# Patient Record
Sex: Female | Born: 1980 | Race: Black or African American | Hispanic: No | Marital: Single | Smoking: Never smoker
Health system: Southern US, Community
[De-identification: ages and names within clinical notes are randomized; demographics above are authoritative.]

---
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
## 2019-07-14 ENCOUNTER — Ambulatory Visit (INDEPENDENT_AMBULATORY_CARE_PROVIDER_SITE_OTHER)
Admission: RE | Admit: 2019-07-14 | Discharge: 2019-07-14 | Disposition: A | Discharge status: Home / Self Care | Payer: Commercial Managed Care - PPO | Source: Ambulatory Visit

## 2019-07-14 ENCOUNTER — Emergency Department (HOSPITAL_BASED_OUTPATIENT_CLINIC_OR_DEPARTMENT_OTHER): Payer: Commercial Managed Care - PPO

## 2019-07-14 ENCOUNTER — Encounter (HOSPITAL_BASED_OUTPATIENT_CLINIC_OR_DEPARTMENT_OTHER): Payer: Self-pay | Admitting: *Deleted

## 2019-07-14 ENCOUNTER — Other Ambulatory Visit: Payer: Self-pay

## 2019-07-14 ENCOUNTER — Emergency Department (HOSPITAL_BASED_OUTPATIENT_CLINIC_OR_DEPARTMENT_OTHER)
Admission: EM | Admit: 2019-07-14 | Discharge: 2019-07-14 | Disposition: A | Discharge status: Home / Self Care | Payer: Commercial Managed Care - PPO | Attending: Emergency Medicine | Admitting: Emergency Medicine

## 2019-07-14 VITALS — BP 141/108 | HR 109 | Temp 97.6°F | Resp 18

## 2019-07-14 DIAGNOSIS — M7989 Other specified soft tissue disorders: Secondary | ICD-10-CM | POA: Diagnosis not present

## 2019-07-14 DIAGNOSIS — M79662 Pain in left lower leg: Secondary | ICD-10-CM | POA: Diagnosis not present

## 2019-07-14 DIAGNOSIS — R2243 Localized swelling, mass and lump, lower limb, bilateral: Secondary | ICD-10-CM | POA: Diagnosis present

## 2019-07-14 NOTE — ED Triage Notes (Signed)
Left leg is swollen and painful x 4 days. She is ambulatory.

## 2019-07-14 NOTE — ED Provider Notes (Signed)
[facility]    CSN: [id number] Arrival date & time: 07/14/19  1848      History   Chief Complaint Chief Complaint  Patient presents with  . Leg Swelling    HPI Tabitha Schaefer is a 39 y.o. female presenting for left lower leg pain and swelling since this weekend.  Denies inciting event, trauma, recent change in medication, diet, lifestyle.  No fever, arthralgias, myalgias.  Patient denying erythema, chest pain, palpitations, difficulty breathing.  No headedness or dizziness.  Denies history of hypercoagulable state, smoking, OCP use.  No family history of DVT, PE.  Has tried OTC medications without relief.   History reviewed. No pertinent past medical history.  There are no problems to display for this patient.   History reviewed. No pertinent surgical history.  OB History   No obstetric history on file.      Home Medications    Prior to Admission medications   Not on File    Family History History reviewed. No pertinent family history.  Social History Social History   Tobacco Use  . Smoking status: Not on file  Substance Use Topics  . Alcohol use: Not on file  . Drug use: Not on file     Allergies   Patient has no known allergies.   Review of Systems As per HPI   Physical Exam Triage Vital Signs ED Triage Vitals  Enc Vitals Group     BP      Pulse      Resp      Temp      Temp src      SpO2      Weight      Height      Head Circumference      Peak Flow      Pain Score      Pain Loc      Pain Edu?      Excl. in GC?    No data found.  Updated Vital Signs BP (!) 141/108   Pulse (!) 109   Temp 97.6 F (36.4 C)   Resp 18   SpO2 97%   Visual Acuity Right Eye Distance:   Left Eye Distance:   Bilateral Distance:    Right Eye Near:   Left Eye Near:    Bilateral Near:     Physical Exam Constitutional:      General: She is not in acute distress. HENT:     Head: Normocephalic and atraumatic.  Eyes:     General: No  scleral icterus.    Pupils: Pupils are equal, round, and reactive to light.  Cardiovascular:     Rate and Rhythm: Normal rate.  Pulmonary:     Effort: Pulmonary effort is normal.  Musculoskeletal:        General: Tenderness present. No deformity. Normal range of motion.     Right lower leg: No edema.     Left lower leg: Edema present.     Comments: No cords palpated, negative Homans' sign bilaterally.  Right ankle 25.5 cm, right calf 41.0 cm.  Left ankle 26 cm, left calf 43.0 cm.  NVI  Skin:    Coloration: Skin is not jaundiced or pale.  Neurological:     Mental Status: She is alert and oriented to person, place, and time.      UC Treatments / Results  Labs (all labs ordered are listed, but only abnormal results are displayed) Labs Reviewed - No data to  display  EKG   Radiology No results found.  Procedures Procedures (including critical care time)  Medications Ordered in UC Medications - No data to display  Initial Impression / Assessment and Plan / UC Course  I have reviewed the triage vital signs and the nursing notes.  Pertinent labs & imaging results that were available during my care of the patient were reviewed by me and considered in my medical decision making (see chart for details).     Patient febrile, nontoxic in office today.  She is hypertensive, though without chest pain, difficulty breathing, abdominal pain.  Patient is low risk overall for blood clot, though does have significant swelling of left lower extremities compared to right without inciting event.  Recommended patient go to ER for further evaluation/DVT rule out.  Patient agreeable to this, electing to self transport and personal vehicle in stable condition. Final Clinical Impressions(s) / UC Diagnoses   Final diagnoses:  Left leg swelling   Discharge Instructions   None    ED Prescriptions    None     PDMP not reviewed this encounter.   Odette Fraction [place], [place] 07/14/19 1936

## 2019-07-14 NOTE — Discharge Instructions (Signed)
Your ultrasound did not show a blood clot in the leg.  Please take Tylenol and ibuprofen for this.  Follow-up with your family doctor in the office.  Please return for worsening pain or if you develop a fever.

## 2019-07-14 NOTE — ED Provider Notes (Signed)
[facility] EMERGENCY DEPARTMENT Provider Note   CSN: [id number] Arrival date & time: 07/14/19  1956     History Chief Complaint  Patient presents with   Leg Swelling    Tabitha Schaefer is a 39 y.o. female.  39 yo F with a chief complaints of left-sided leg pain.  Pain is on the anterior aspect and worse with ambulation and palpation.  She had worsening pain actually the beginning of her symptoms and it has improved somewhat over the past couple days.  She went to urgent care and was sent here to rule out a DVT.  She denies any history of blood clot in her legs or lungs.  She denies trauma to the area.  Denies fevers or chills.  The history is provided by the patient.  Illness Severity:  Moderate Onset quality:  Gradual Duration:  2 days Timing:  Constant Progression:  Worsening Chronicity:  New Associated symptoms: myalgias   Associated symptoms: no chest pain, no congestion, no fever, no headaches, no nausea, no rhinorrhea, no shortness of breath, no vomiting and no wheezing        History reviewed. No pertinent past medical history.  There are no problems to display for this patient.   Past Surgical History:  Procedure Laterality Date   CESAREAN SECTION       OB History   No obstetric history on file.     No family history on file.  Social History   Tobacco Use   Smoking status: Never Smoker   Smokeless tobacco: Never Used  Substance Use Topics   Alcohol use: Not Currently   Drug use: Never    Home Medications Prior to Admission medications   Medication Sig Start Date End Date Taking? Authorizing Provider  Amitriptyline HCl (ELAVIL PO) Take by mouth.   Yes [provider]    Allergies    Patient has no known allergies.  Review of Systems   Review of Systems  Constitutional: Negative for chills and fever.  HENT: Negative for congestion and rhinorrhea.   Eyes: Negative for redness and visual disturbance.  Respiratory:  Negative for shortness of breath and wheezing.   Cardiovascular: Negative for chest pain and palpitations.  Gastrointestinal: Negative for nausea and vomiting.  Genitourinary: Negative for dysuria and urgency.  Musculoskeletal: Positive for arthralgias and myalgias.  Skin: Negative for pallor and wound.  Neurological: Negative for dizziness and headaches.    Physical Exam Updated Vital Signs BP (!) 128/98    Pulse 100    Temp 99 F (37.2 C) (Oral)    Resp 18    Ht 5\' 4"  (1.626 m)    Wt 81.6 kg    LMP 06/30/2019    SpO2 99%    BMI 30.90 kg/m   Physical Exam Vitals and nursing note reviewed.  Constitutional:      General: She is not in acute distress.    Appearance: She is well-developed. She is not diaphoretic.  HENT:     Head: Normocephalic and atraumatic.  Eyes:     Pupils: Pupils are equal, round, and reactive to light.  Cardiovascular:     Rate and Rhythm: Normal rate and regular rhythm.     Heart sounds: No murmur heard.  No friction rub. No gallop.   Pulmonary:     Effort: Pulmonary effort is normal.     Breath sounds: No wheezing or rales.  Abdominal:     General: There is no distension.  Palpations: Abdomen is soft.     Tenderness: There is no abdominal tenderness.  Musculoskeletal:        General: Tenderness present.     Cervical back: Normal range of motion and neck supple.     Comments: Tenderness along the musculature of the anterior aspect of the leg.  No specific bony tenderness.  Full range of motion of the knee and the ankle.  Pulse motor and sensation are intact distally.  I do not appreciate any difference in size from 1 leg to the other.  Skin:    General: Skin is warm and dry.  Neurological:     Mental Status: She is alert and oriented to person, place, and time.  Psychiatric:        Behavior: Behavior normal.     ED Results / Procedures / Treatments   Labs (all labs ordered are listed, but only abnormal results are displayed) Labs Reviewed -  No data to display  EKG None  Radiology US Venous Img Lower Unilateral Left  Result Date: 07/14/2019 CLINICAL DATA:  Left lower leg pain and swelling EXAM: Left LOWER EXTREMITY VENOUS DOPPLER ULTRASOUND TECHNIQUE: Gray-scale sonography with compression, as well as color and duplex ultrasound, were performed to evaluate the deep venous system(s) from the level of the common femoral vein through the popliteal and proximal calf veins. COMPARISON:  None. FINDINGS: VENOUS Normal compressibility of the common femoral, superficial femoral, and popliteal veins, as well as the visualized calf veins. Visualized portions of profunda femoral vein and great saphenous vein unremarkable. No filling defects to suggest DVT on grayscale or color Doppler imaging. Doppler waveforms show normal direction of venous flow, normal respiratory plasticity and response to augmentation. Limited views of the contralateral common femoral vein are unremarkable. OTHER None. Limitations: none IMPRESSION: Negative. Electronically Signed   By: Jasmine Pang M.D.   On: 07/14/2019 20:33    Procedures Procedures (including critical care time)  Medications Ordered in ED Medications - No data to display  ED Course  I have reviewed the triage vital signs and the nursing notes.  Pertinent labs & imaging results that were available during my care of the patient were reviewed by me and considered in my medical decision making (see chart for details).    MDM Rules/Calculators/A&P                          39 yo F with a chief complaints of left leg pain.  She tells me that it was significantly swollen but is improved over the past couple days.  Pain is to the anterior aspect of the leg.  Seems less likely to be a DVT.  DVT study was ordered in triage.  Will await the results.  Most likely musculoskeletal based on history and physical.  [place] negative for dvt.  D/c home.   8:45 PM:  I have discussed the diagnosis/risks/treatment options  with the patient and believe the pt to be eligible for discharge home to follow-up with PCP. We also discussed returning to the ED immediately if new or worsening sx occur. We discussed the sx which are most concerning (e.g., sudden worsening pain, fever, inability to tolerate by mouth) that necessitate immediate return. Medications administered to the patient during their visit and any new prescriptions provided to the patient are listed below.  Medications given during this visit Medications - No data to display   The patient appears reasonably screen and/or stabilized for  discharge and I doubt any other medical condition or other [facility] requiring further screening, evaluation, or treatment in the ED at this time prior to discharge.   Final Clinical Impression(s) / ED Diagnoses Final diagnoses:  Pain in shin, left    Rx / DC Orders ED Discharge Orders    None       Melene Plan, DO 07/14/19 2045

## 2019-07-14 NOTE — ED Notes (Signed)
AVS reviewed with pt, opportunity for questions provided. Pain, comfort measures discussed with pt with proper dosing of Tylenol use alternating with Ibuprofen use

## 2022-01-11 IMAGING — US US EXTREM LOW VENOUS*L*
1 series · 14 of 24 positions shown · non-contrast
Comparison: None.

CLINICAL DATA: Left lower leg pain and swelling

EXAM:
Left LOWER EXTREMITY VENOUS DOPPLER ULTRASOUND
TECHNIQUE: Gray-scale sonography with compression, as well as color and duplex
ultrasound, were performed to evaluate the deep venous system(s)
from the level of the common femoral vein through the popliteal and
proximal calf veins.

[Series 1: us extrem low venous*left* · 14 of 28 slices shown]
[im 1/28]
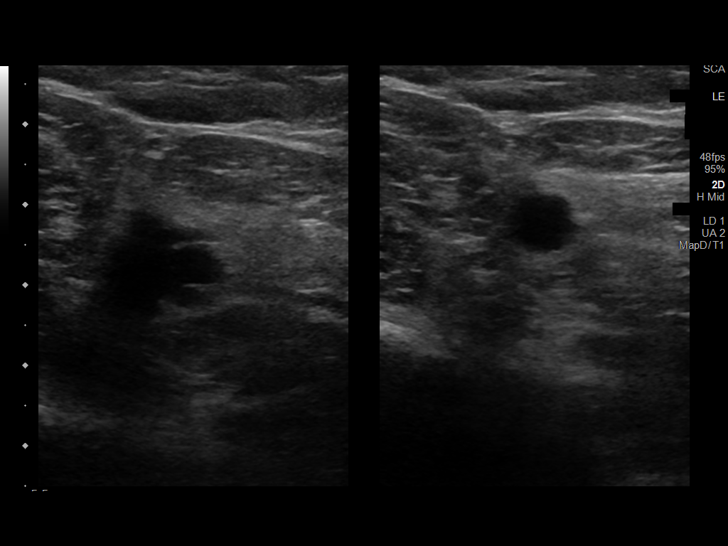
[im 3/28]
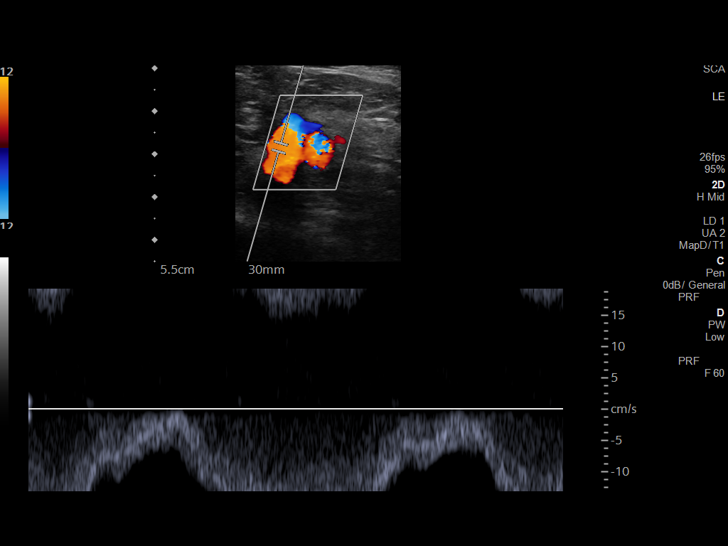
[im 5/28]
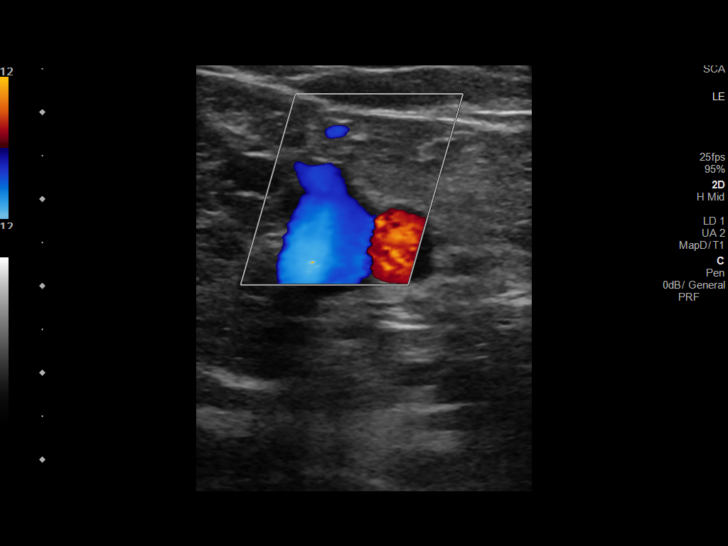
[im 8/28]
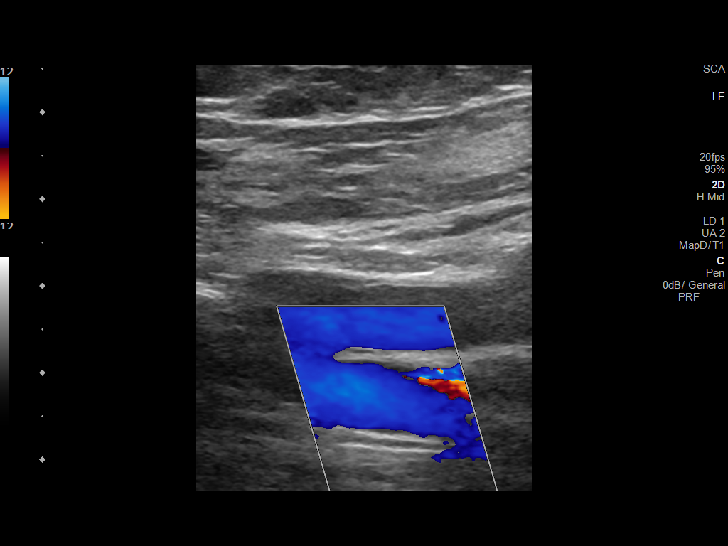
[im 9/28]
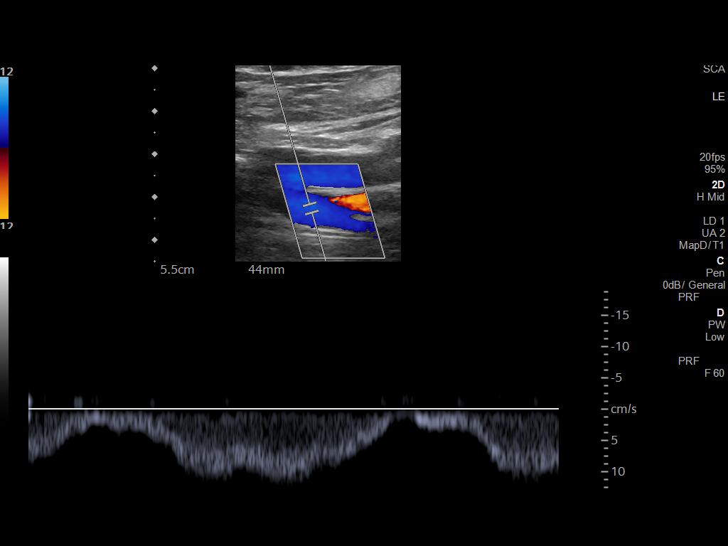
[im 11/28]
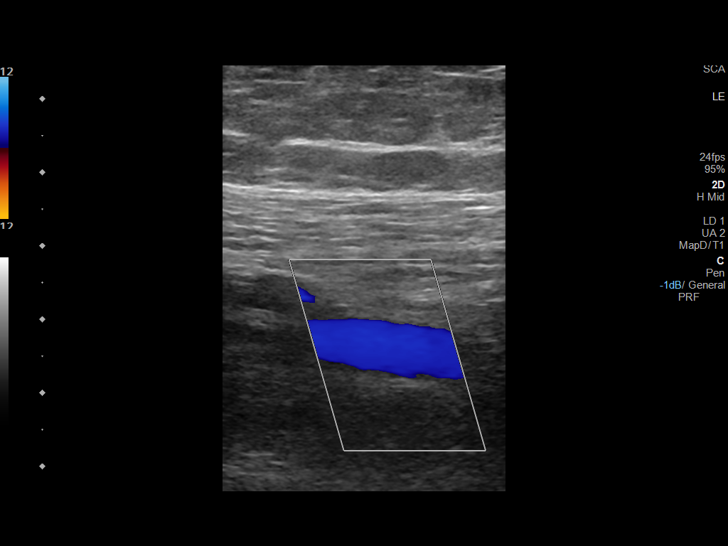
[im 13/28]
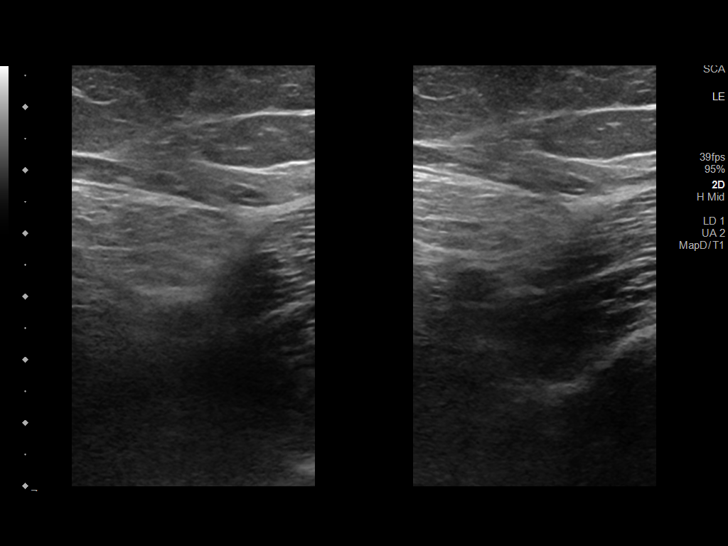
[im 15/28]
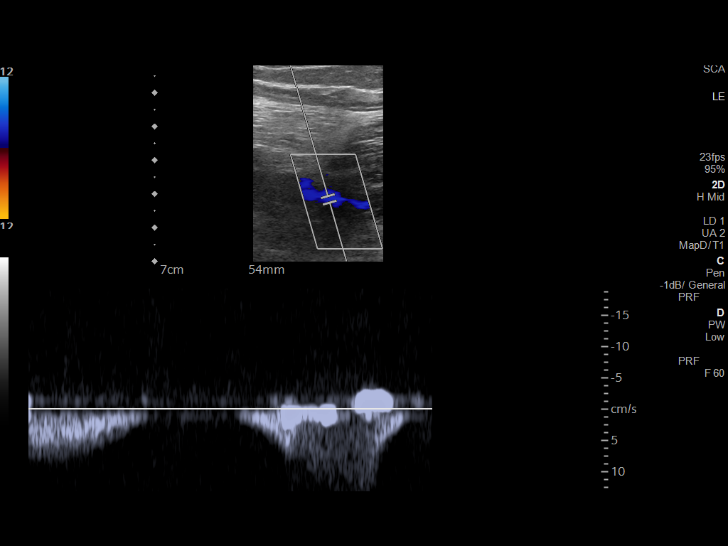
[im 17/28]
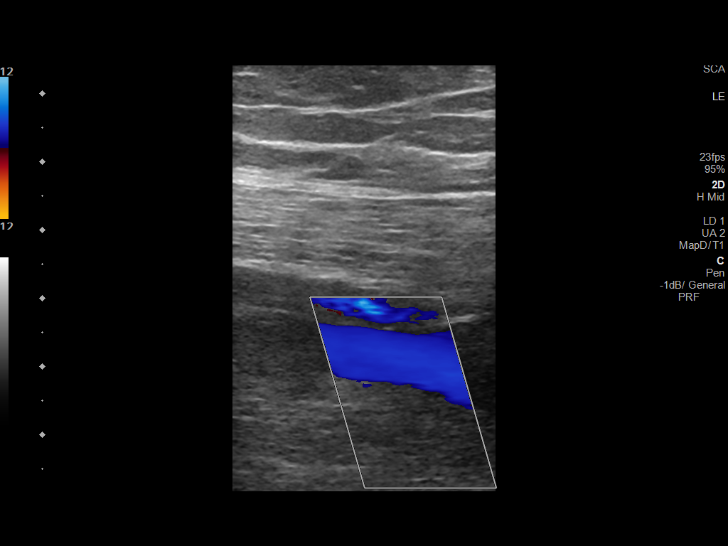
[im 19/28]
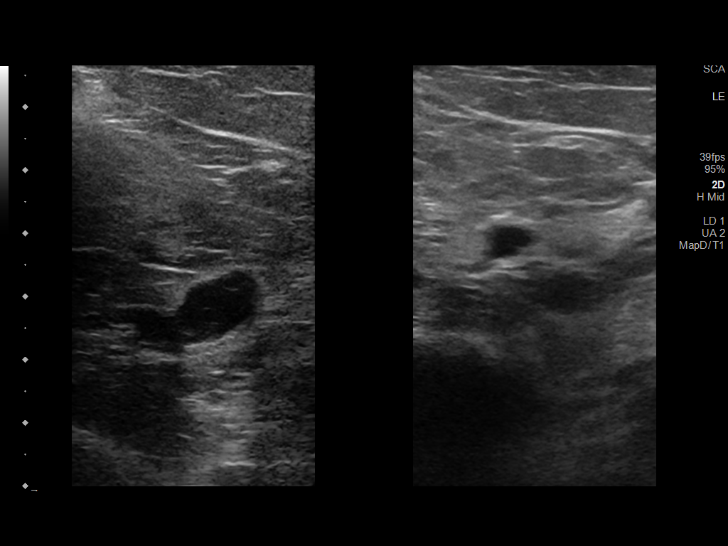
[im 22/28]
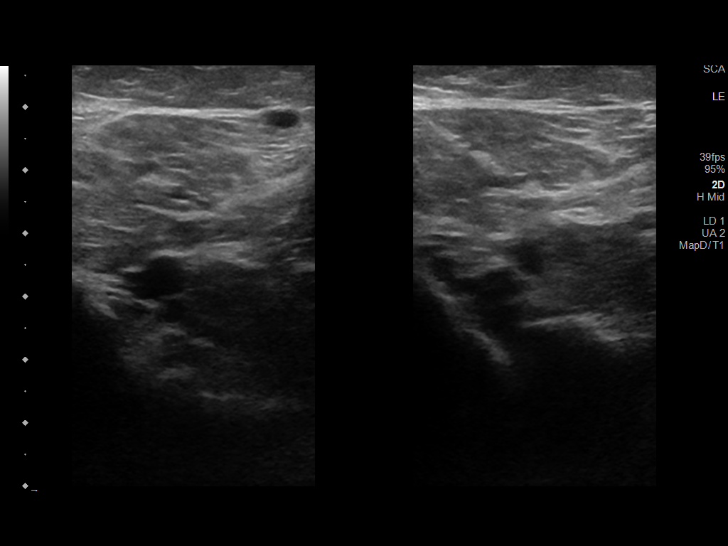
[im 23/28]
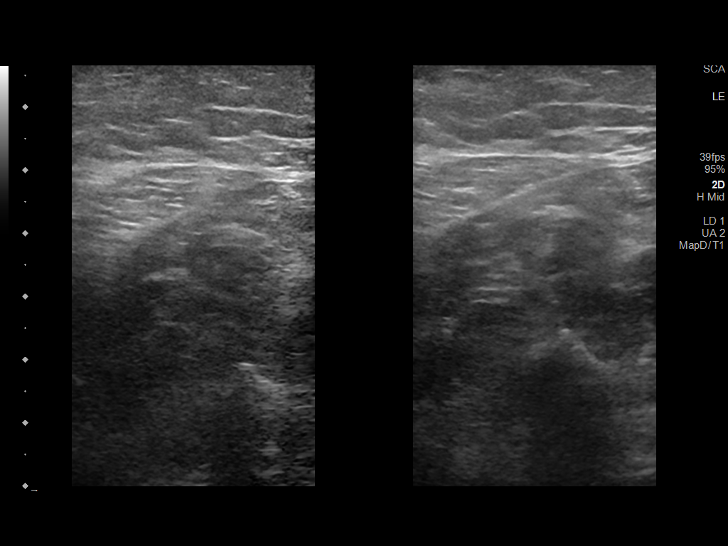
[im 25/28]
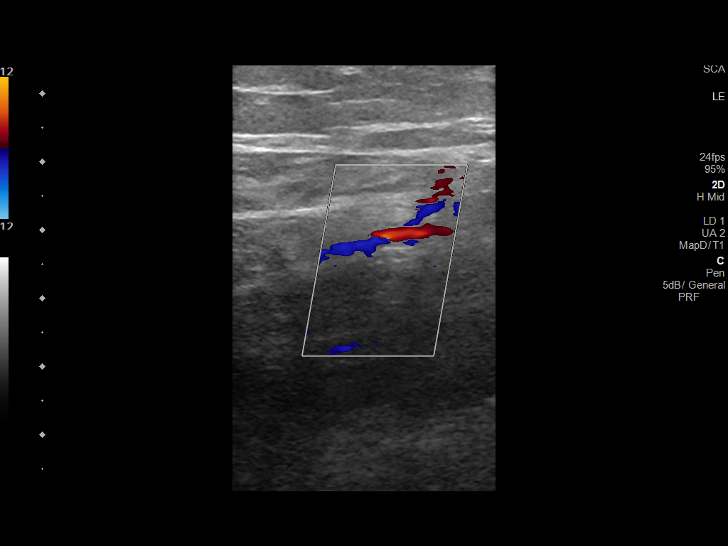
[im 28/28]
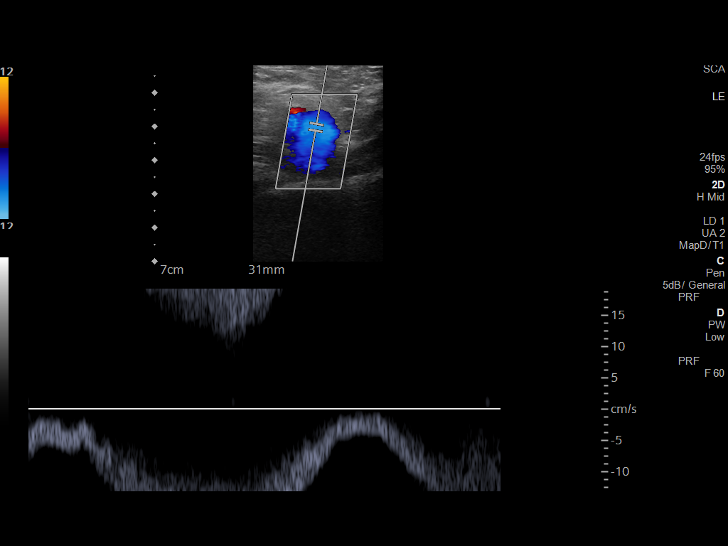

[14 of 24 positions shown; findings below may reference images not displayed]

FINDINGS: VENOUS

Normal compressibility of the common femoral, superficial femoral,
and popliteal veins, as well as the visualized calf veins.
Visualized portions of profunda femoral vein and great saphenous
vein unremarkable. No filling defects to suggest DVT on grayscale or
color Doppler imaging. Doppler waveforms show normal direction of
venous flow, normal respiratory plasticity and response to
augmentation.

Limited views of the contralateral common femoral vein are
unremarkable.

OTHER

None.

Limitations: none
IMPRESSION: Negative.

## 2024-01-20 ENCOUNTER — Encounter (HOSPITAL_COMMUNITY): Payer: Self-pay

## 2024-01-20 ENCOUNTER — Other Ambulatory Visit: Payer: Self-pay

## 2024-01-20 ENCOUNTER — Emergency Department (HOSPITAL_COMMUNITY): Admission: EM | Admit: 2024-01-20 | Discharge: 2024-01-20 | Disposition: A | Discharge status: Home / Self Care

## 2024-01-20 DIAGNOSIS — R109 Unspecified abdominal pain: Secondary | ICD-10-CM | POA: Diagnosis present

## 2024-01-20 DIAGNOSIS — R110 Nausea: Secondary | ICD-10-CM | POA: Insufficient documentation

## 2024-01-20 DIAGNOSIS — E876 Hypokalemia: Secondary | ICD-10-CM | POA: Insufficient documentation

## 2024-01-20 DIAGNOSIS — N9489 Other specified conditions associated with female genital organs and menstrual cycle: Secondary | ICD-10-CM | POA: Diagnosis not present

## 2024-01-20 LAB — COMPREHENSIVE METABOLIC PANEL WITH GFR
ALT: 36 U/L (ref 0–44)
AST: 32 U/L (ref 15–41)
Albumin: 4.3 g/dL (ref 3.5–5.0)
Alkaline Phosphatase: 65 U/L (ref 38–126)
Anion gap: 12 (ref 5–15)
BUN: 7 mg/dL (ref 6–20)
CO2: 21 mmol/L — ABNORMAL LOW (ref 22–32)
Calcium: 8.6 mg/dL — ABNORMAL LOW (ref 8.9–10.3)
Chloride: 105 mmol/L (ref 98–111)
Creatinine, Ser: 0.65 mg/dL (ref 0.44–1.00)
GFR, Estimated: >60 mL/min
Glucose, Bld: 95 mg/dL (ref 70–99)
Potassium: 3.4 mmol/L — ABNORMAL LOW (ref 3.5–5.1)
Sodium: 138 mmol/L (ref 135–145)
Total Bilirubin: 0.2 mg/dL (ref 0.0–1.2)
Total Protein: 8.1 g/dL (ref 6.5–8.1)

## 2024-01-20 LAB — CBC
HCT: 34.8 % — ABNORMAL LOW (ref 36.0–46.0)
Hemoglobin: 10.2 g/dL — ABNORMAL LOW (ref 12.0–15.0)
MCH: 21.8 pg — ABNORMAL LOW (ref 26.0–34.0)
MCHC: 29.3 g/dL — ABNORMAL LOW (ref 30.0–36.0)
MCV: 74.5 fL — ABNORMAL LOW (ref 80.0–100.0)
Platelets: 635 K/uL — ABNORMAL HIGH (ref 150–400)
RBC: 4.67 MIL/uL (ref 3.87–5.11)
RDW: 18.6 % — ABNORMAL HIGH (ref 11.5–15.5)
WBC: 9.2 K/uL (ref 4.0–10.5)
nRBC: 0 % (ref 0.0–0.2)

## 2024-01-20 LAB — URINALYSIS, ROUTINE W REFLEX MICROSCOPIC
Bacteria, UA: NONE SEEN
Glucose, UA: NEGATIVE mg/dL
Ketones, ur: NEGATIVE mg/dL
Leukocytes,Ua: NEGATIVE
Nitrite: NEGATIVE
Protein, ur: 100 mg/dL — AB
RBC / HPF: >50 RBC/hpf (ref 0–5)
Specific Gravity, Urine: 1.04 — ABNORMAL HIGH (ref 1.005–1.030)
pH: 5 (ref 5.0–8.0)

## 2024-01-20 LAB — HCG, SERUM, QUALITATIVE: Preg, Serum: NEGATIVE

## 2024-01-20 LAB — LIPASE, BLOOD: Lipase: 27 U/L (ref 11–51)

## 2024-01-20 MED ORDER — ONDANSETRON HCL 4 MG PO TABS: 4.0000 mg | ORAL_TABLET | Freq: Four times a day (QID) | ORAL | 0 refills | Status: AC

## 2024-01-20 MED ORDER — KETOROLAC TROMETHAMINE 15 MG/ML IJ SOLN
15.0000 mg | Freq: Once | INTRAMUSCULAR | Status: AC
  Administered 2024-01-20: 15 mg via INTRAMUSCULAR
  Filled 2024-01-20: qty 1

## 2024-01-20 MED ORDER — OXYCODONE-ACETAMINOPHEN 5-325 MG PO TABS: 1.0000 | ORAL_TABLET | Freq: Four times a day (QID) | ORAL | 0 refills | Status: AC | PRN

## 2024-01-20 MED ORDER — KETOROLAC TROMETHAMINE 10 MG PO TABS: 10.0000 mg | ORAL_TABLET | Freq: Four times a day (QID) | ORAL | 0 refills | Status: AC | PRN

## 2024-01-20 MED ORDER — OXYCODONE-ACETAMINOPHEN 5-325 MG PO TABS
1.0000 | ORAL_TABLET | Freq: Once | ORAL | Status: AC
  Administered 2024-01-20: 1 via ORAL
  Filled 2024-01-20: qty 1

## 2024-01-20 MED ORDER — ONDANSETRON 4 MG PO TBDP
4.0000 mg | ORAL_TABLET | Freq: Once | ORAL | Status: AC
  Administered 2024-01-20: 4 mg via ORAL
  Filled 2024-01-20: qty 1

## 2024-01-20 NOTE — ED Provider Notes (Signed)
 " [place] [facility] DEPART[facility] AT [facility] Provider Note   CSN: [id number] Arrival date & time: 01/20/24  1850     Patient presents with: Abdominal Pain   Tabitha Schaefer is a 44 y.o. female.  44 year old female presents to the emergency department today with complaints of uterine cramping since 3 PM.  Patient reports she took norethindrone 5 mg 3 times a day for 10 days to delay her cycle through the holidays.  She reports she does not typically take this but did this past time just to delay cycle.  She started her cycle yesterday and patient reports normal uterine cramping and normal amount of bleeding but at 3 PM today she had significant increase in uterine cramping she describes as contractions.  She reports bleeding has stayed the same as her normal bleeding but she does have some associated nausea without vomiting.  Patient advised that she had tubal ligation 17 years ago and frequent OB/GYN follow-ups with no known concerning history.  Patient has trialed Tylenol  and ibuprofen without relief earlier today.     Prior to Admission medications  Medication Sig Start Date End Date Taking? Authorizing Provider  ketorolac  (TORADOL ) 10 MG tablet Take 1 tablet (10 mg total) by mouth every 6 (six) hours as needed. 01/20/24  Yes Myriam Fonda RAMAN, PA-C  ondansetron  (ZOFRAN ) 4 MG tablet Take 1 tablet (4 mg total) by mouth every 6 (six) hours. 01/20/24  Yes Myriam Fonda RAMAN, PA-C  oxyCODONE -acetaminophen  (PERCOCET/ROXICET) 5-325 MG tablet Take 1 tablet by mouth every 6 (six) hours as needed for severe pain (pain score 7-10). 01/20/24  Yes Myriam Fonda RAMAN, PA-C  Amitriptyline HCl (ELAVIL PO) Take by mouth.    [provider]    Allergies: Patient has no known allergies.    Review of Systems  Gastrointestinal:  Positive for abdominal pain.  All other systems reviewed and are negative.   Updated Vital Signs BP 134/87 (BP Location: Right Arm)   Pulse 93   Temp 98.6 F (37 C)    Resp 17   Ht 5' 4 (1.626 m)   Wt 74.4 kg   LMP 01/20/2024 (Exact Date)   SpO2 99%   BMI 28.15 kg/m   Physical Exam Vitals and nursing note reviewed.  Constitutional:      Appearance: Normal appearance.  HENT:     Head: Normocephalic and atraumatic.     Nose: Nose normal.  Eyes:     Extraocular Movements: Extraocular movements intact.     Conjunctiva/sclera: Conjunctivae normal.     Pupils: Pupils are equal, round, and reactive to light.  Cardiovascular:     Rate and Rhythm: Normal rate.  Pulmonary:     Effort: Pulmonary effort is normal. No respiratory distress.     Breath sounds: Normal breath sounds.  Abdominal:     General: Abdomen is flat. Bowel sounds are normal. There is no distension.     Palpations: Abdomen is soft.     Tenderness: There is abdominal tenderness in the right lower quadrant and left lower quadrant.     Comments: Patient has pain to palpation in her suprapubic area with no radiation into her abdomen.  Musculoskeletal:        General: Normal range of motion.     Cervical back: Normal range of motion.  Skin:    General: Skin is warm.     Capillary Refill: Capillary refill takes less than 2 seconds.  Neurological:     General: No focal  deficit present.     Mental Status: She is alert.  Psychiatric:        Mood and Affect: Mood normal.        Behavior: Behavior normal.     (all labs ordered are listed, but only abnormal results are displayed) Labs Reviewed  COMPREHENSIVE METABOLIC PANEL WITH GFR - Abnormal; Notable for the following components:      Result Value   Potassium 3.4 (*)    CO2 21 (*)    Calcium 8.6 (*)    All other components within normal limits  CBC - Abnormal; Notable for the following components:   Hemoglobin 10.2 (*)    HCT 34.8 (*)    MCV 74.5 (*)    MCH 21.8 (*)    MCHC 29.3 (*)    RDW 18.6 (*)    Platelets 635 (*)    All other components within normal limits  URINALYSIS, ROUTINE W REFLEX MICROSCOPIC - Abnormal;  Notable for the following components:   APPearance HAZY (*)    Specific Gravity, Urine 1.040 (*)    Hgb urine dipstick LARGE (*)    Bilirubin Urine SMALL (*)    Protein, ur 100 (*)    All other components within normal limits  LIPASE, BLOOD  HCG, SERUM, QUALITATIVE    EKG: None  Radiology: No results found.   Procedures   Medications Ordered in the ED  ketorolac  (TORADOL ) 15 MG/ML injection 15 mg (15 mg Intramuscular Given 01/20/24 2201)  ondansetron  (ZOFRAN -ODT) disintegrating tablet 4 mg (4 mg Oral Given 01/20/24 2201)  oxyCODONE -acetaminophen  (PERCOCET/ROXICET) 5-325 MG per tablet 1 tablet (1 tablet Oral Given 01/20/24 2339)    44 y.o. female presents to the ED with complaints of uterine cramping, The differential diagnosis includes uterine cramping, ovarian torsion, abnormal uterine bleeding, anemia (Ddx)  On arrival pt is nontoxic, vitals concerning for hypertension. Exam significant for pain to palpation in the suprapubic area.   I ordered medication Toradol  and Zofran  for pain and nausea  Lab Tests:  CMP lipase CBC UA hCG ordered prior to initial evaluation.  CMP remarkable for mild hypokalemia 3.4.  CBC remarkable for decreased hemoglobin at 10.2.  UA remarkable for a large amount of hemoglobinuria and patient is on cycle and began her cycle yesterday.  ED Course:   Patient walking around ED room during initial evaluation.  Patient has some reported pain to palpation in her suprapubic area.  She reports it feels like uterine contractions like when she had her child.  Pain is suprapubic with radiation into her hips and buttocks, no pain over bilateral ovaries. Bleeding is consistent with normal periods and she denies any excessive bleeding.  Since patient does not typically take norethindrone and took it 3 times daily for 10 days this may be residual effects from her period.  She does have typical bleeding and no associated vomiting diarrhea or constipation.  On  reassessment patient is sitting comfortably in the ED bed.  She reports that Toradol  has helped the pain slightly but the pain is still coming in waves.  She reports since 3 PM today it has been coming in waves like this and will be about 30 minutes in between episodes.  On reassessment pain is still managed with Toradol . Patient's pain appears to be exacerbated uterine cramping secondary to norethindrone. Patient was advised to call OB/GYN in the morning for further evaluation.  Patient was given strict return precautions with at home Toradol  and oxycodone  if needed.  Patient  agreed with treatment plan and was comfortable discharge at this time.   Portions of this note were generated with Scientist, clinical (histocompatibility and immunogenetics). Dictation errors may occur despite best attempts at proofreading.   Final diagnoses:  Uterine cramping    ED Discharge Orders          Ordered    ketorolac  (TORADOL ) 10 MG tablet  Every 6 hours PRN        01/20/24 2339    oxyCODONE -acetaminophen  (PERCOCET/ROXICET) 5-325 MG tablet  Every 6 hours PRN        01/20/24 2339    ondansetron  (ZOFRAN ) 4 MG tablet  Every 6 hours        01/20/24 2342               Halia Franey S, PA-C 01/21/24 0026    Ula Prentice SAUNDERS, MD 01/23/24 [phone]  "

## 2024-01-20 NOTE — ED Triage Notes (Signed)
 Began having severe generalized abdominal pains that manifested ~3pm today.   Says she recently took NORETHINDRONE to delay her menstrual cycle.   Describes pain as severe cramping. Does admit to vaginal bleeding as she is on her cycle.

## 2024-01-20 NOTE — Discharge Instructions (Addendum)
 Lab work and exam are reassuring.  It is recommended that you call your OB/GYN first thing in the morning for reevaluation.  I have given you Toradol  and oxycodone  to take for pain at home.  If you begin having significant amount of bleeding as we discussed in the emergency department today, more than 1 pad per hour please return to the ED for further evaluation.  If any other new or concerning symptoms arise please return to ED for further evaluation.
